# Patient Record
Sex: Male | Born: 2000 | Race: Black or African American | Hispanic: No | Marital: Single | Smoking: Never smoker
Health system: Southern US, Community
[De-identification: ages and names within clinical notes are randomized; demographics above are authoritative.]

---
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
## 2020-03-18 IMAGING — CT CT HEAD W/O CM
4 series · 16 of 47 positions shown, 18 images · non-contrast
Comparison: None.

CLINICAL DATA: The patient suffered a fall and blow to the head
today playing basketball. Possible seizure after the incident.

EXAM:
CT HEAD WITHOUT CONTRAST
TECHNIQUE: Contiguous axial images were obtained from the base of the skull
through the vertex without intravenous contrast.

[Series 2: head bone · axial · 0.45mm/px · z∈[-147,-119]mm · 3 of 74 slices shown]
[im 8/74  bone]
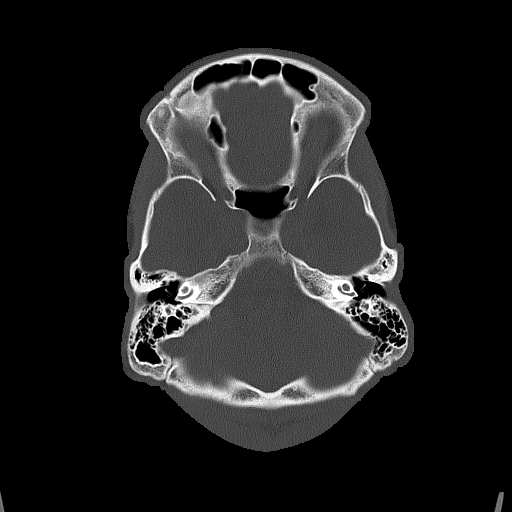
[im 15/74  bone]
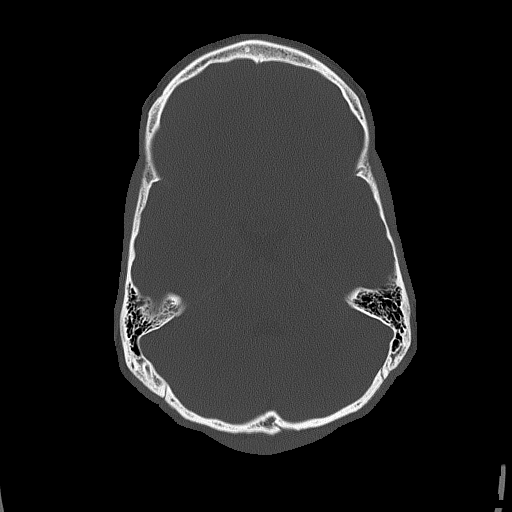
[im 22/74  bone]
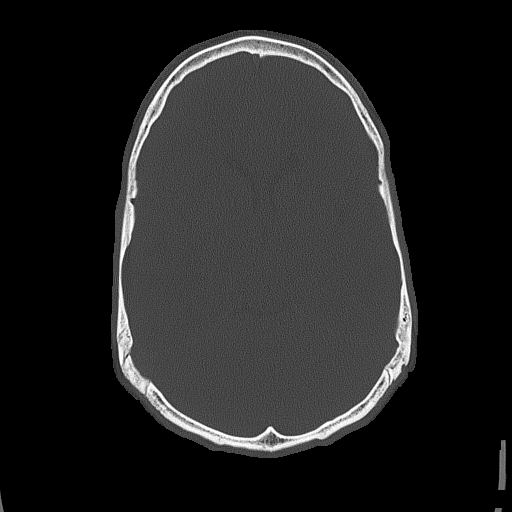

[Series 3: head wo · axial · 0.45mm/px · z∈[-146,-36]mm · 7 of 30 slices shown, 9 images]
[im 4/30  brain]
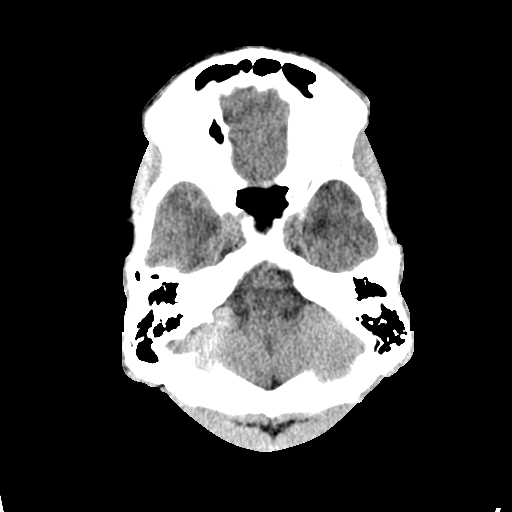
[im 4/30  bone]
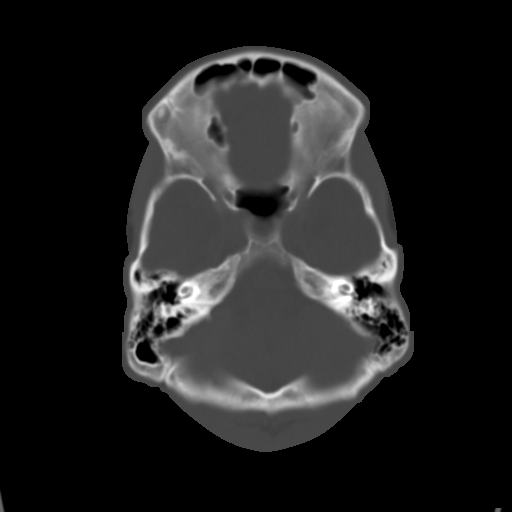
[im 8/30  brain]
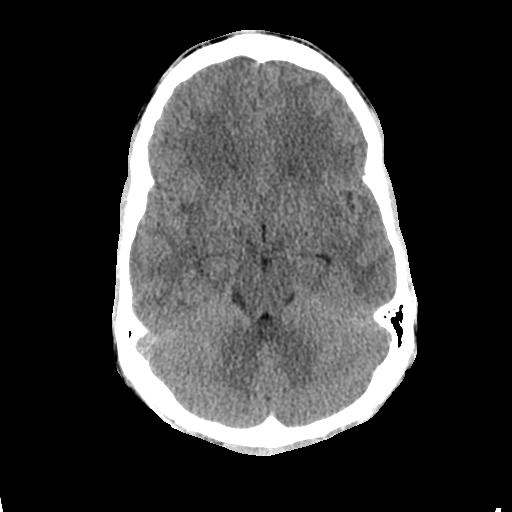
[im 11/30  brain]
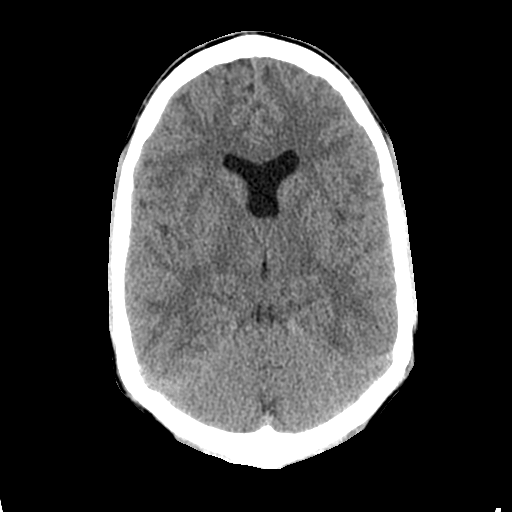
[im 15/30  brain]
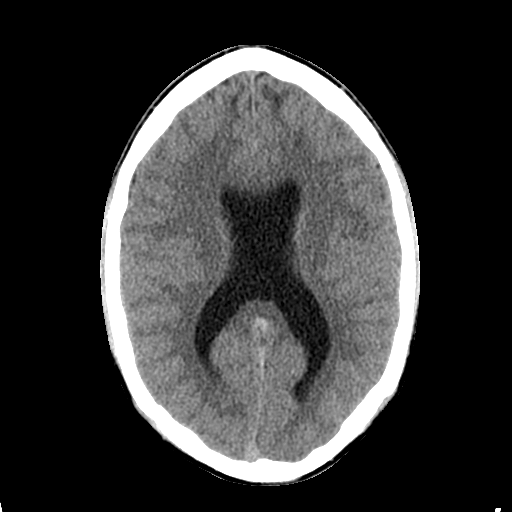
[im 19/30  brain]
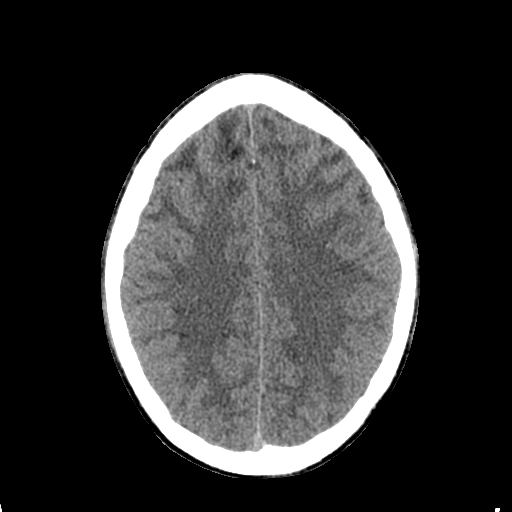
[im 19/30  bone]
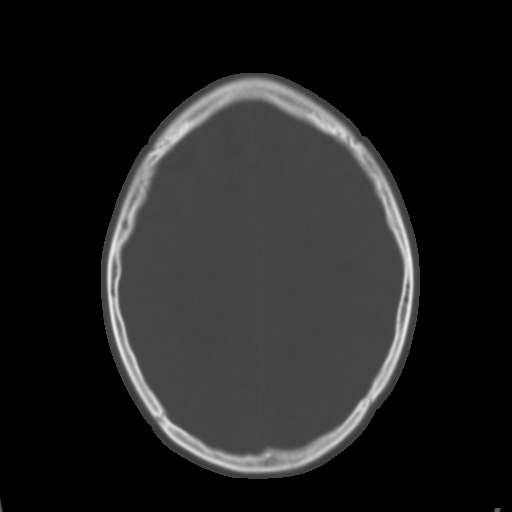
[im 22/30  brain]
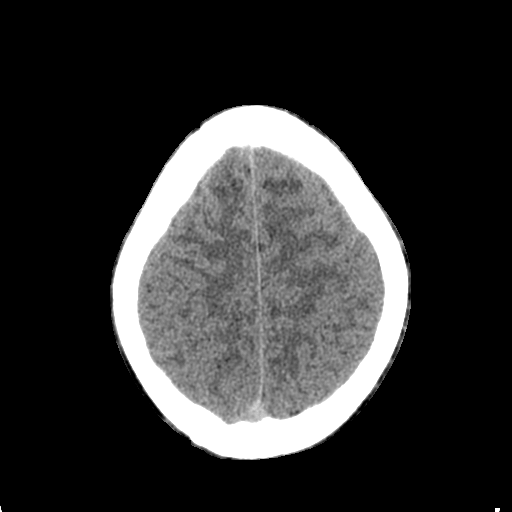
[im 26/30  brain]
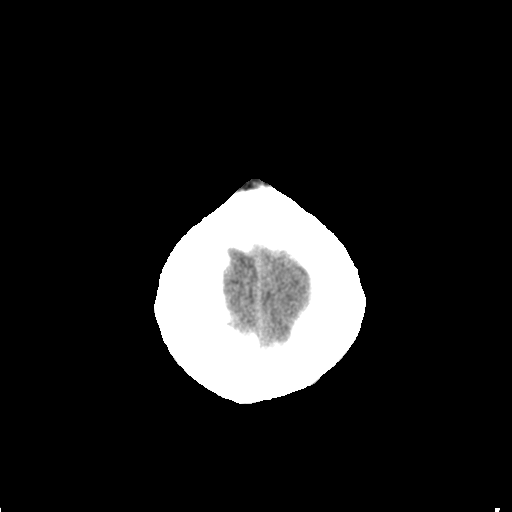

[Series 4: coronal soft tissue · coronal · 0.32mm/px · 3 of 70 slices shown]
[im 24/70  brain]
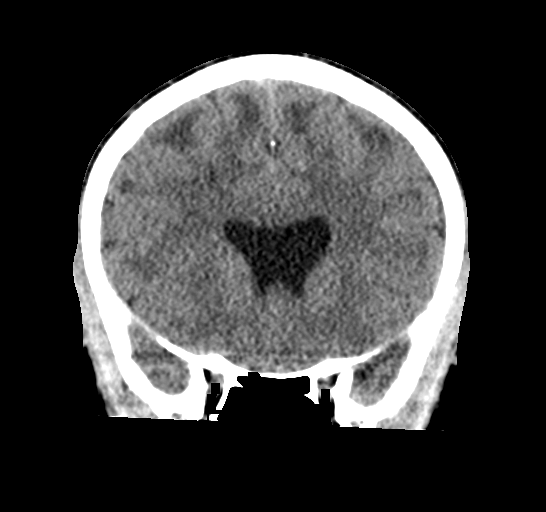
[im 31/70  brain]
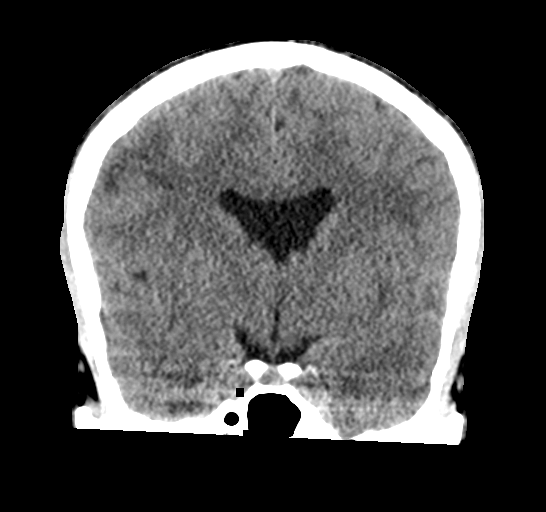
[im 39/70  brain]
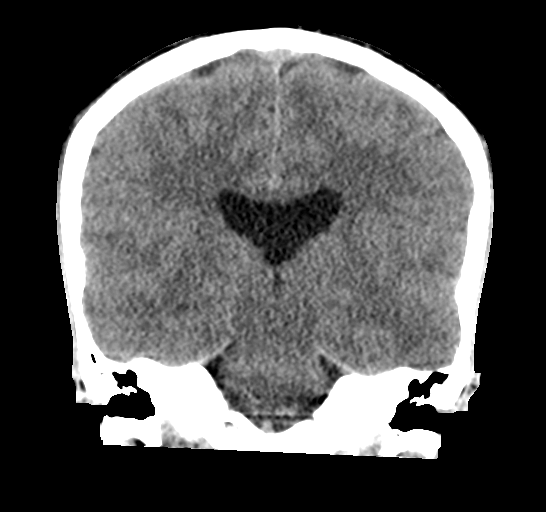

[Series 5: sagittal soft tissue · sagittal · 0.34mm/px · 3 of 52 slices shown]
[im 18/52  brain]
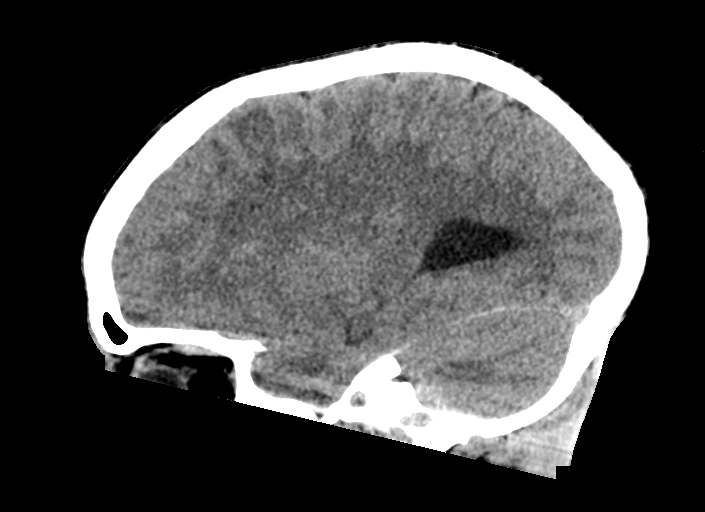
[im 26/52  brain]
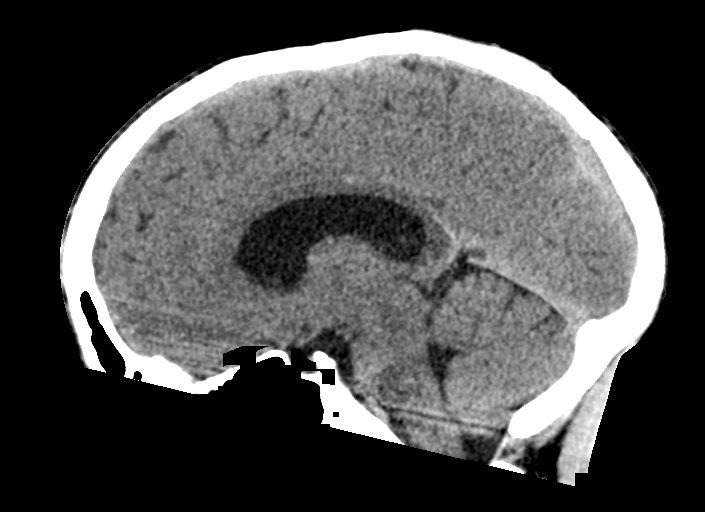
[im 35/52  brain]
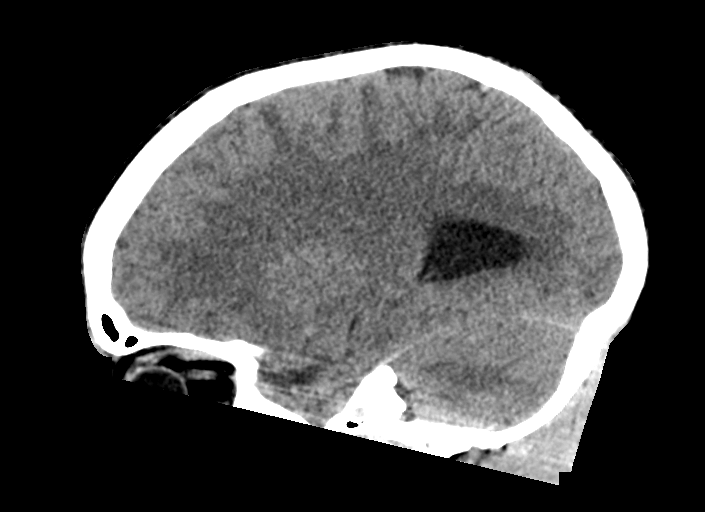

[16 of 47 positions shown; findings below may reference images not displayed]

FINDINGS: Brain: No evidence of acute infarction, hemorrhage, hydrocephalus,
extra-axial collection or mass lesion/mass effect.

Vascular: No hyperdense vessel or unexpected calcification.

Skull: Normal. Negative for fracture or focal lesion.

Sinuses/Orbits: Negative.

Other: None.
IMPRESSION: Normal head CT.

## 2021-02-08 ENCOUNTER — Ambulatory Visit: Payer: Medicaid Other | Admitting: Podiatry
